# Patient Record
Sex: Female | Born: 1955
Health system: Southern US, Community
[De-identification: ages and names within clinical notes are randomized; demographics above are authoritative.]

---

## 2014-12-07 ENCOUNTER — Inpatient Hospital Stay
Admission: RE | Admit: 2014-12-07 | Discharge: 2014-12-30 | Disposition: A | Discharge status: Skilled Nursing Facility | Payer: Self-pay | Source: Other Acute Inpatient Hospital | Attending: Internal Medicine | Admitting: Internal Medicine

## 2014-12-07 ENCOUNTER — Other Ambulatory Visit (HOSPITAL_COMMUNITY): Payer: Self-pay

## 2014-12-07 DIAGNOSIS — Z794 Long term (current) use of insulin: Secondary | ICD-10-CM

## 2014-12-07 DIAGNOSIS — J690 Pneumonitis due to inhalation of food and vomit: Secondary | ICD-10-CM

## 2014-12-07 DIAGNOSIS — I639 Cerebral infarction, unspecified: Secondary | ICD-10-CM

## 2014-12-07 DIAGNOSIS — IMO0001 Reserved for inherently not codable concepts without codable children: Secondary | ICD-10-CM

## 2014-12-07 DIAGNOSIS — Z931 Gastrostomy status: Secondary | ICD-10-CM

## 2014-12-07 DIAGNOSIS — F411 Generalized anxiety disorder: Secondary | ICD-10-CM

## 2014-12-07 DIAGNOSIS — J9690 Respiratory failure, unspecified, unspecified whether with hypoxia or hypercapnia: Secondary | ICD-10-CM | POA: Insufficient documentation

## 2014-12-07 DIAGNOSIS — N179 Acute kidney failure, unspecified: Secondary | ICD-10-CM

## 2014-12-07 DIAGNOSIS — I469 Cardiac arrest, cause unspecified: Secondary | ICD-10-CM

## 2014-12-07 DIAGNOSIS — E119 Type 2 diabetes mellitus without complications: Secondary | ICD-10-CM

## 2014-12-07 DIAGNOSIS — Z930 Tracheostomy status: Secondary | ICD-10-CM

## 2014-12-08 ENCOUNTER — Other Ambulatory Visit (HOSPITAL_COMMUNITY): Payer: Self-pay

## 2014-12-08 DIAGNOSIS — E119 Type 2 diabetes mellitus without complications: Secondary | ICD-10-CM

## 2014-12-08 DIAGNOSIS — IMO0001 Reserved for inherently not codable concepts without codable children: Secondary | ICD-10-CM

## 2014-12-08 DIAGNOSIS — Z794 Long term (current) use of insulin: Secondary | ICD-10-CM

## 2014-12-08 DIAGNOSIS — J690 Pneumonitis due to inhalation of food and vomit: Secondary | ICD-10-CM

## 2014-12-08 DIAGNOSIS — J9610 Chronic respiratory failure, unspecified whether with hypoxia or hypercapnia: Secondary | ICD-10-CM

## 2014-12-08 DIAGNOSIS — J9690 Respiratory failure, unspecified, unspecified whether with hypoxia or hypercapnia: Secondary | ICD-10-CM | POA: Insufficient documentation

## 2014-12-08 DIAGNOSIS — I639 Cerebral infarction, unspecified: Secondary | ICD-10-CM

## 2014-12-08 DIAGNOSIS — F411 Generalized anxiety disorder: Secondary | ICD-10-CM

## 2014-12-08 DIAGNOSIS — I469 Cardiac arrest, cause unspecified: Secondary | ICD-10-CM

## 2014-12-08 DIAGNOSIS — Z930 Tracheostomy status: Secondary | ICD-10-CM

## 2014-12-08 DIAGNOSIS — N179 Acute kidney failure, unspecified: Secondary | ICD-10-CM

## 2014-12-08 DIAGNOSIS — Z931 Gastrostomy status: Secondary | ICD-10-CM

## 2014-12-08 LAB — COMPREHENSIVE METABOLIC PANEL
ALBUMIN: 2.8 g/dL — AB (ref 3.5–5.2)
ALT: 23 U/L (ref 0–35)
AST: 34 U/L (ref 0–37)
Alkaline Phosphatase: 65 U/L (ref 39–117)
Anion gap: 12 (ref 5–15)
BUN: 28 mg/dL — ABNORMAL HIGH (ref 6–23)
CHLORIDE: 102 mmol/L (ref 96–112)
CO2: 23 mmol/L (ref 19–32)
CREATININE: 1.02 mg/dL (ref 0.50–1.10)
Calcium: 9.5 mg/dL (ref 8.4–10.5)
GFR calc Af Amer: 68 mL/min — ABNORMAL LOW (ref >90)
GFR, EST NON AFRICAN AMERICAN: 59 mL/min — AB (ref >90)
GLUCOSE: 209 mg/dL — AB (ref 70–99)
Potassium: 3.7 mmol/L (ref 3.5–5.1)
Sodium: 137 mmol/L (ref 135–145)
TOTAL PROTEIN: 7.7 g/dL (ref 6.0–8.3)
Total Bilirubin: 0.3 mg/dL (ref 0.3–1.2)

## 2014-12-08 LAB — CBC
HCT: 30.5 % — ABNORMAL LOW (ref 36.0–46.0)
Hemoglobin: 10.1 g/dL — ABNORMAL LOW (ref 12.0–15.0)
MCH: 30.6 pg (ref 26.0–34.0)
MCHC: 33.1 g/dL (ref 30.0–36.0)
MCV: 92.4 fL (ref 78.0–100.0)
Platelets: 385 10*3/uL (ref 150–400)
RBC: 3.3 MIL/uL — ABNORMAL LOW (ref 3.87–5.11)
RDW: 13 % (ref 11.5–15.5)
WBC: 10 10*3/uL (ref 4.0–10.5)

## 2014-12-08 LAB — CLOSTRIDIUM DIFFICILE BY PCR: Toxigenic C. Difficile by PCR: POSITIVE — AB

## 2014-12-08 NOTE — Consult Note (Signed)
Name: Angie Porter MRN: [id number] DOB: [birth year]    ADMISSION DATE:  12/07/2014 CONSULTATION DATE: 1/26  [facility]  CHIEF COMPLAINT:  T collar  BRIEF PATIENT DESCRIPTION:  MO AF on t collar  SIGNIFICANT EVENTS  [facility]  STUDIES:     HISTORY OF PRESENT ILLNESS:   Discussion: 59 yo AAF with underlying IDDM , HTN, poorly controlled who presented to [facility] 11/21/14 with N/V and bradycardia. She suffered a brief cardiac arrest with bradycardia that required CPR and Epi. Along with intubation and 3 pressor support. She developed AKF and requires transient cvvh. 2 days post admit she suffered massive rt basal ganglion stroke and required tracheostomy. [facility] but remains trach and peg dependent. Her mental status is marginal and PCCM consulted for tracheostomy care.  PAST MEDICAL HISTORY :   has no past medical history on file.  has no past surgical history on file. Prior to Admission medications   reviewed   All: [place] FAMILY HISTORY:  Reviewed  SOCIAL HISTORY:   Reviewed  REVIEW OF SYSTEMS:   na SUBJECTIVE:   VITAL SIGNS: Vital signs reviewed. Abnormal values will appear under impression plan section.    PHYSICAL EXAMINATION: General:  Obese AAF on t collar Neuro: Follows commands weakly on rt side. Lethargic HEENT:  Trach->t collar Cardiovascular:  HSR RRR Lungs:  Decreased bs bases Abdomen:  Obese, peg in place Musculoskeletal:  intact Skin:  warm   Recent Labs Lab 12/08/14 0700  NA 137  K 3.7  CL 102  CO2 23  BUN 28*  CREATININE 1.02  GLUCOSE 209*    Recent Labs Lab 12/08/14 0700  HGB 10.1*  HCT 30.5*  WBC 10.0  PLT 385   Dg Chest Port 1 View  12/08/2014   CLINICAL DATA:  Respiratory failure.  EXAM: PORTABLE CHEST - 1 VIEW  COMPARISON:  None.  FINDINGS: A tracheostomy appliance is in place with the tip at the level of the clavicular heads. The lungs are borderline hypoinflated. The interstitial markings  are minimally prominent. The retrocardiac region on the left demonstrates increased density. There is no pleural effusion or pneumothorax. The cardiac silhouette is mildly enlarged. The pulmonary vascularity is not engorged.  IMPRESSION: There is left lower lobe atelectasis or pneumonia. There is no definite pulmonary edema.   Electronically Signed   By: David  [place]   On: 12/08/2014 07:49   Dg Abd Portable 1v  12/07/2014   CLINICAL DATA:  59 year old female with a history of percutaneous gastrostomy tube placement.  EXAM: PORTABLE ABDOMEN - 1 VIEW  COMPARISON:  None.  FINDINGS: Gas present within small bowel and colon. The stomach gas is present above the level of the transverse colon, suggesting ileocolic ligament intact.  Percutaneous gastrostomy tube projects over the left upper quadrant, with infused contrast within both the tube and stomach lumen. No extraluminal contrast is identified.  No unexpected radiopaque foreign objects.  Pelvic thermister in place.  No displaced fractures, with degenerative changes of the lumbar spine.  IMPRESSION: Unremarkable bowel gas pattern.  Percutaneous gastrostomy in place, with the tip confirmed in the stomach lumen by infusion of contrast.  Signed,  Yvone NeuJaime S. Loreta AveWagner, DO  [facility]  [facility]   Electronically Signed   By: Gilmer MorJaime  Wagner D.O.   On: 12/07/2014 23:25    ASSESSMENT  :   Tracheostomy dependent placed 12/02/14   CVA (cerebral infarction)    Cardiac  arrest 11/21/17   Hypoxic ischemic encephalopathy    Acute renal failure   IDDM (insulin dependent diabetes mellitus)   Aspiration pneumonia   Anxiety state   PEG (percutaneous endoscopic gastrostomy) status placed 12/04/14.  Discussion: 59 yo AAF with underlying IDDM , HTN, poorly controlled who presented to [facility]/21/14 with N/V and bradycardia. She suffered a brief cardiac arrest with bradycardia that required CPR and Epi. Along with intubation and 3  pressor support. She developed AKF and requires transient cvvh. 2 days post admit she suffered massive rt basal ganglion stroke and required tracheostomy. She was [facility] but remains trach and peg dependent. Her mental status is marginal and PCCM consulted for tracheostomy care.   PLAN: Trach collar until Mental status improved Follw cxr for rt lower lobe atx/asp pna  Brett Canales Minor ACNP Adolph Pollack PCCM Pager [phone] till 3 pm If no answer page [phone]  Chronic trach, on TC, mental status precludes decannulation, atelectatic RLL, aspiration can not be ruled out, would not recommend cuff down, recommend swallow evaluation, airway clearance measures as ordered.  Will follow up on Monday.  Patient seen and examined, agree with above note.  I dictated the care and orders written for this patient under my direction.  Alyson Reedy, MD [phone]  12/08/2014, 12:24 PM

## 2014-12-09 NOTE — Progress Notes (Signed)
Name: Angie Porter MRN: [id number] DOB: [birth year]    ADMISSION DATE:  12/07/2014 CONSULTATION DATE: 1/26  REFERRING MD :  [facility]  CHIEF COMPLAINT:  T collar  BRIEF PATIENT DESCRIPTION:  MO AF on t collar  SIGNIFICANT EVENTS  1/25 tx to [facility]  STUDIES:     HISTORY OF PRESENT ILLNESS:   Discussion: 59 yo AAF with underlying IDDM , HTN, poorly controlled who presented to [facility] 11/21/14 with N/V and bradycardia. She suffered a brief cardiac arrest with bradycardia that required CPR and Epi. Along with intubation and 3 pressor support. She developed AKF and requires transient cvvh. 2 days post admit she suffered massive rt basal ganglion stroke and required tracheostomy. She was [facility] from [facility] but remains trach and peg dependent. Her mental status is marginal and PCCM consulted for tracheostomy care.  PAST MEDICAL HISTORY :   has no past medical history on file.  has no past surgical history on file. Prior to Admission medications   reviewed   All: [place] FAMILY HISTORY:  Reviewed  SOCIAL HISTORY:   Reviewed  REVIEW OF SYSTEMS:   na SUBJECTIVE: No events.  VITAL SIGNS: Vital signs reviewed. Abnormal values will appear under impression plan section.    PHYSICAL EXAMINATION: General:  Obese AAF on t collar Neuro: Follows commands weakly on rt side. Lethargic HEENT:  Trach->t collar Cardiovascular:  HSR RRR Lungs:  Decreased bs bases Abdomen:  Obese, peg in place Musculoskeletal:  intact Skin:  warm   Recent Labs Lab 12/08/14 0700  NA 137  K 3.7  CL 102  CO2 23  BUN 28*  CREATININE 1.02  GLUCOSE 209*    Recent Labs Lab 12/08/14 0700  HGB 10.1*  HCT 30.5*  WBC 10.0  PLT 385   Dg Chest Port 1 View  12/08/2014   CLINICAL DATA:  Respiratory failure.  EXAM: PORTABLE CHEST - 1 VIEW  COMPARISON:  None.  FINDINGS: A tracheostomy appliance is in place with the tip at the level of the clavicular heads. The lungs are borderline hypoinflated. The interstitial  markings are minimally prominent. The retrocardiac region on the left demonstrates increased density. There is no pleural effusion or pneumothorax. The cardiac silhouette is mildly enlarged. The pulmonary vascularity is not engorged.  IMPRESSION: There is left lower lobe atelectasis or pneumonia. There is no definite pulmonary edema.   Electronically Signed   By: David  [place]   On: 12/08/2014 07:49   Dg Abd Portable 1v  12/07/2014   CLINICAL DATA:  59 year old female with a history of percutaneous gastrostomy tube placement.  EXAM: PORTABLE ABDOMEN - 1 VIEW  COMPARISON:  None.  FINDINGS: Gas present within small bowel and colon. The stomach gas is present above the level of the transverse colon, suggesting ileocolic ligament intact.  Percutaneous gastrostomy tube projects over the left upper quadrant, with infused contrast within both the tube and stomach lumen. No extraluminal contrast is identified.  No unexpected radiopaque foreign objects.  Pelvic thermister in place.  No displaced fractures, with degenerative changes of the lumbar spine.  IMPRESSION: Unremarkable bowel gas pattern.  Percutaneous gastrostomy in place, with the tip confirmed in the stomach lumen by infusion of contrast.  Signed,  Yvone Neu. Loreta Ave, DO  [facility]  [facility]   Electronically Signed   By: Gilmer Mor D.O.   On: 12/07/2014 23:25    ASSESSMENT  :   Tracheostomy dependent placed 12/02/14   CVA (cerebral infarction)  Cardiac arrest 11/21/17   Hypoxic ischemic encephalopathy    Acute renal failure   IDDM (insulin dependent diabetes mellitus)   Aspiration pneumonia   Anxiety state   PEG (percutaneous endoscopic gastrostomy) status placed 12/04/14.  Discussion: 59 yo AAF with underlying IDDM , HTN, poorly controlled who [facility] with N/V and bradycardia. She suffered a brief cardiac arrest with bradycardia that required CPR and Epi. Along with  intubation and 3 pressor support. She developed AKF and requires transient cvvh. 2 days post admit she suffered massive rt basal ganglion stroke and required tracheostomy. [facility] but remains trach and peg dependent. Her mental status is marginal and PCCM consulted for tracheostomy care.   PLAN: Trach collar 24/7. Follw cxr for rt lower lobe atx/asp pna with some resolution Not a candidate for decannulation ever given neuro status.  Alyson ReedyWesam G. Katianna Mcclenney, M.D. [facility]. Pager: [phone]. After hours pager: [phone].  12/09/2014, 2:27 PM

## 2014-12-13 LAB — BASIC METABOLIC PANEL
ANION GAP: 7 (ref 5–15)
BUN: 17 mg/dL (ref 6–23)
CO2: 28 mmol/L (ref 19–32)
Calcium: 10.1 mg/dL (ref 8.4–10.5)
Chloride: 100 mmol/L (ref 96–112)
Creatinine, Ser: 0.98 mg/dL (ref 0.50–1.10)
GFR, EST AFRICAN AMERICAN: 71 mL/min — AB (ref >90)
GFR, EST NON AFRICAN AMERICAN: 61 mL/min — AB (ref >90)
GLUCOSE: 428 mg/dL — AB (ref 70–99)
Potassium: 3.5 mmol/L (ref 3.5–5.1)
SODIUM: 135 mmol/L (ref 135–145)

## 2014-12-13 LAB — CBC
HCT: 34.5 % — ABNORMAL LOW (ref 36.0–46.0)
Hemoglobin: 11.7 g/dL — ABNORMAL LOW (ref 12.0–15.0)
MCH: 30.9 pg (ref 26.0–34.0)
MCHC: 33.9 g/dL (ref 30.0–36.0)
MCV: 91 fL (ref 78.0–100.0)
Platelets: 422 10*3/uL — ABNORMAL HIGH (ref 150–400)
RBC: 3.79 MIL/uL — AB (ref 3.87–5.11)
RDW: 13.3 % (ref 11.5–15.5)
WBC: 10.5 10*3/uL (ref 4.0–10.5)

## 2014-12-19 LAB — BASIC METABOLIC PANEL
Anion gap: 10 (ref 5–15)
BUN: 27 mg/dL — ABNORMAL HIGH (ref 6–23)
CHLORIDE: 97 mmol/L (ref 96–112)
CO2: 27 mmol/L (ref 19–32)
Calcium: 9.9 mg/dL (ref 8.4–10.5)
Creatinine, Ser: 1.1 mg/dL (ref 0.50–1.10)
GFR calc Af Amer: 62 mL/min — ABNORMAL LOW (ref >90)
GFR calc non Af Amer: 53 mL/min — ABNORMAL LOW (ref >90)
GLUCOSE: 365 mg/dL — AB (ref 70–99)
Potassium: 3.5 mmol/L (ref 3.5–5.1)
SODIUM: 134 mmol/L — AB (ref 135–145)

## 2014-12-19 LAB — CBC
HEMATOCRIT: 37 % (ref 36.0–46.0)
Hemoglobin: 12.6 g/dL (ref 12.0–15.0)
MCH: 31.3 pg (ref 26.0–34.0)
MCHC: 34.1 g/dL (ref 30.0–36.0)
MCV: 92 fL (ref 78.0–100.0)
Platelets: 282 10*3/uL (ref 150–400)
RBC: 4.02 MIL/uL (ref 3.87–5.11)
RDW: 13.6 % (ref 11.5–15.5)
WBC: 11.9 10*3/uL — AB (ref 4.0–10.5)

## 2014-12-21 DIAGNOSIS — J690 Pneumonitis due to inhalation of food and vomit: Secondary | ICD-10-CM

## 2014-12-21 NOTE — Progress Notes (Signed)
   Name: Angie Porter MRN: [id number] DOB: 1956-02-19    ADMISSION DATE:  12/07/2014 CONSULTATION DATE: 1/26  [facility]  CHIEF COMPLAINT:  Trach care  BRIEF PATIENT DESCRIPTION:  MO AF on t collar  SIGNIFICANT EVENTS  [facility]  STUDIES:    SUBJECTIVE: Doing well on ATC, now has # 6 cuffless trach.  VITAL SIGNS:  Mildly hypertensive otherwise VSS.   PHYSICAL EXAMINATION: General:  Obese AAF on ATC Neuro: Awake, follows commands, weak on R. HEENT:  Trach in place on ATC. Cardiovascular:  RRR. Lungs:  Decreased BS in bases. Abdomen:  Obese, peg in place. Musculoskeletal:  Intact. Skin:  Warm, dry.  Recent Labs Lab 12/19/14 0600  NA 134*  K 3.5  CL 97  CO2 27  BUN 27*  CREATININE 1.10  GLUCOSE 365*    Recent Labs Lab 12/19/14 0600  HGB 12.6  HCT 37.0  WBC 11.9*  PLT 282   No results found.  ASSESSMENT:   Tracheostomy dependent placed 12/02/14   CVA (cerebral infarction)    Cardiac arrest 11/21/17   Hypoxic ischemic encephalopathy    Acute renal failure   IDDM (insulin dependent diabetes mellitus)   Aspiration pneumonia   Anxiety state   PEG (percutaneous endoscopic gastrostomy) status placed 12/04/14.  Discussion: 59 yo AAF with underlying IDDM , HTN, poorly controlled who [facility] with N/V and bradycardia. She suffered a brief cardiac arrest with bradycardia that required CPR and Epi. Along with intubation and 3 pressor support. She developed AKF and requires transient cvvh. 2 days post admit she suffered massive rt basal ganglion stroke and required tracheostomy. [facility] but remains trach and peg dependent. Her mental status is marginal and PCCM consulted for tracheostomy care.  [facility] has now downsized to #6 cuffless trach and is doing well.  No secretions.   PLAN: Continue ATC.  Would not decanulate due to CVA / neuro status. F/u CXR intermittently. PCCM will sign off.  Please do not  hesitate to call us back if we can be of any further assistance.   Rutherford Guysahul Desai, PA - [facility] [place] [facility] Pgr: [phone]  or [phone]  Agree with above  ALVA,RAKESH V. md 12/21/2014, 12:15 PM

## 2014-12-26 LAB — BASIC METABOLIC PANEL
ANION GAP: 10 (ref 5–15)
BUN: 24 mg/dL — AB (ref 6–23)
CO2: 26 mmol/L (ref 19–32)
CREATININE: 1.02 mg/dL (ref 0.50–1.10)
Calcium: 9.9 mg/dL (ref 8.4–10.5)
Chloride: 101 mmol/L (ref 96–112)
GFR calc Af Amer: 68 mL/min — ABNORMAL LOW (ref >90)
GFR calc non Af Amer: 59 mL/min — ABNORMAL LOW (ref >90)
Glucose, Bld: 326 mg/dL — ABNORMAL HIGH (ref 70–99)
Potassium: 4 mmol/L (ref 3.5–5.1)
Sodium: 137 mmol/L (ref 135–145)

## 2014-12-26 LAB — CBC
HCT: 36.7 % (ref 36.0–46.0)
HEMOGLOBIN: 12.1 g/dL (ref 12.0–15.0)
MCH: 30.8 pg (ref 26.0–34.0)
MCHC: 33 g/dL (ref 30.0–36.0)
MCV: 93.4 fL (ref 78.0–100.0)
Platelets: 241 10*3/uL (ref 150–400)
RBC: 3.93 MIL/uL (ref 3.87–5.11)
RDW: 13.1 % (ref 11.5–15.5)
WBC: 8.9 10*3/uL (ref 4.0–10.5)

## 2014-12-28 LAB — BASIC METABOLIC PANEL WITH GFR
Anion gap: 9 (ref 5–15)
BUN: 19 mg/dL (ref 6–23)
CO2: 26 mmol/L (ref 19–32)
Calcium: 10.5 mg/dL (ref 8.4–10.5)
Chloride: 100 mmol/L (ref 96–112)
Creatinine, Ser: 0.93 mg/dL (ref 0.50–1.10)
GFR calc Af Amer: 76 mL/min — ABNORMAL LOW
GFR calc non Af Amer: 65 mL/min — ABNORMAL LOW
Glucose, Bld: 321 mg/dL — ABNORMAL HIGH (ref 70–99)
Potassium: 4.4 mmol/L (ref 3.5–5.1)
Sodium: 135 mmol/L (ref 135–145)

## 2014-12-28 LAB — CBC
HCT: 38 % (ref 36.0–46.0)
Hemoglobin: 13.3 g/dL (ref 12.0–15.0)
MCH: 31.9 pg (ref 26.0–34.0)
MCHC: 35 g/dL (ref 30.0–36.0)
MCV: 91.1 fL (ref 78.0–100.0)
Platelets: 280 K/uL (ref 150–400)
RBC: 4.17 MIL/uL (ref 3.87–5.11)
RDW: 12.7 % (ref 11.5–15.5)
WBC: 10.5 K/uL (ref 4.0–10.5)

## 2015-07-14 IMAGING — CR DG CHEST 1V PORT
1 series · 1 of 1 positions shown · non-contrast
Comparison: None.

CLINICAL DATA: Respiratory failure.

EXAM:
PORTABLE CHEST - 1 VIEW

[AP]
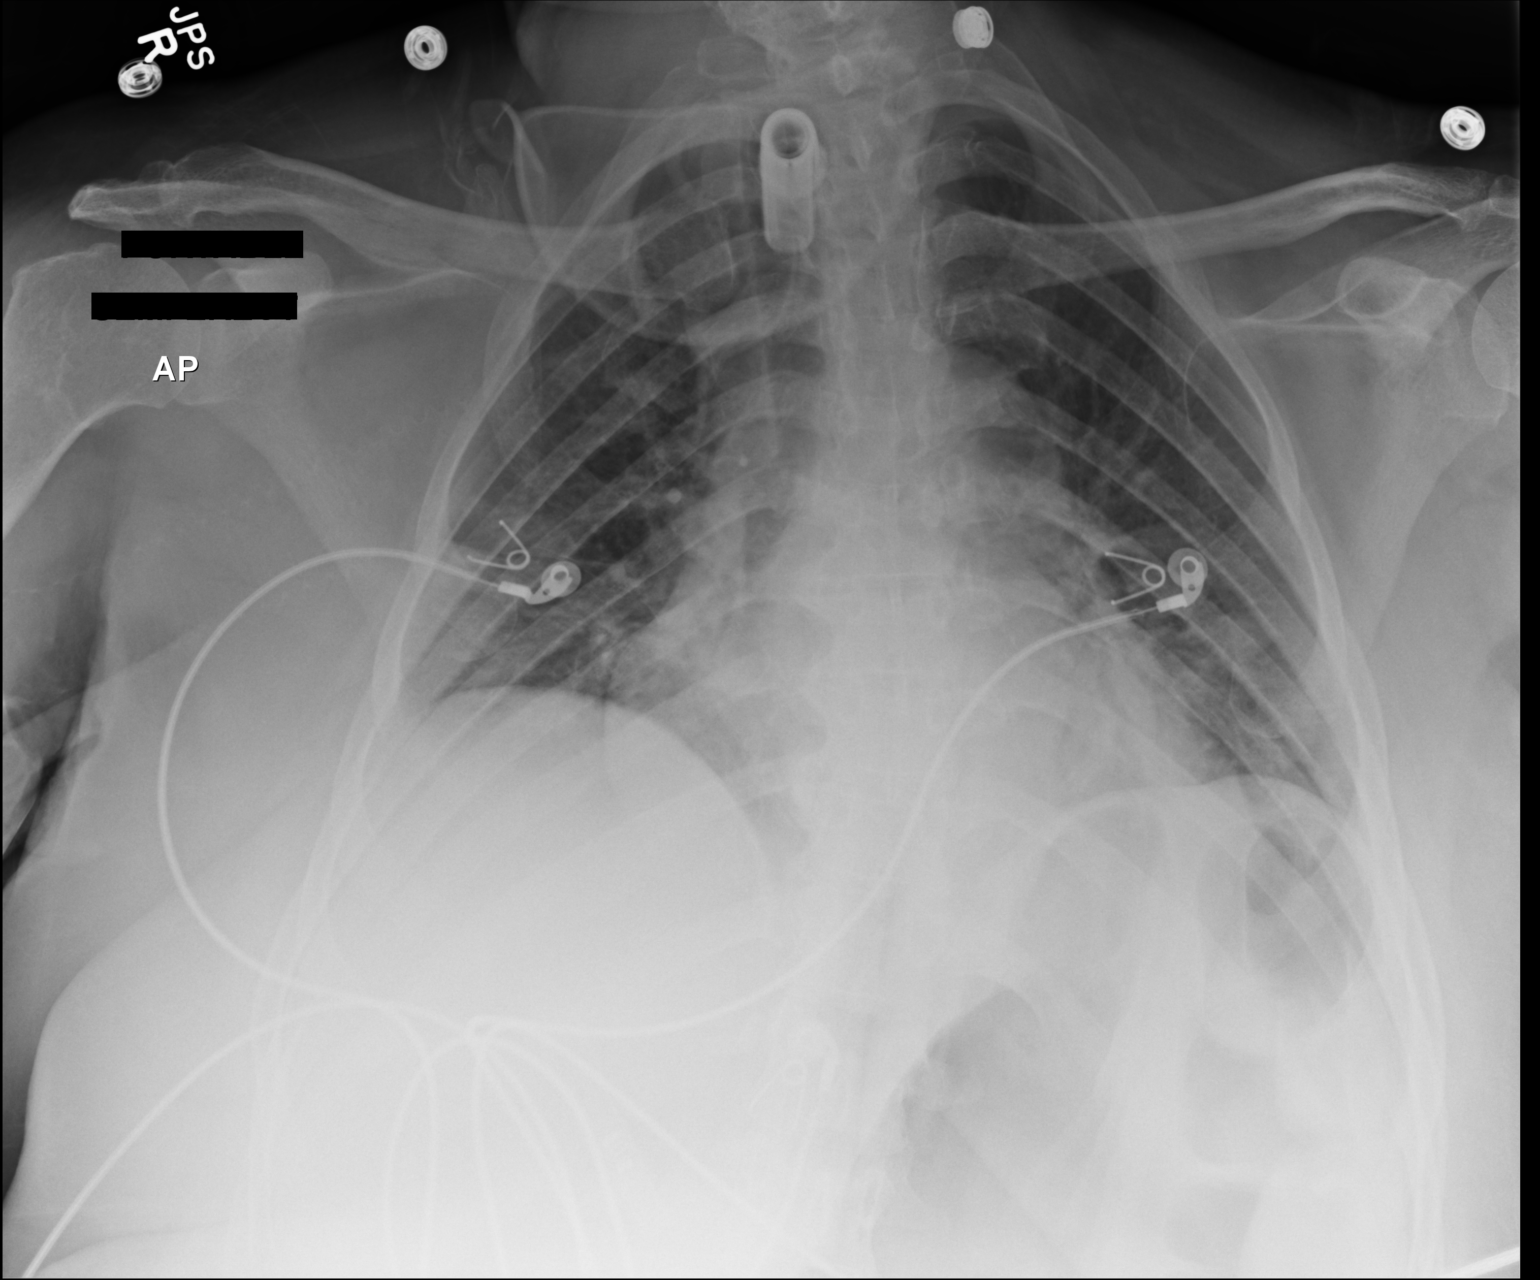

[1 of 1 positions shown; findings below may reference images not displayed]

FINDINGS: A tracheostomy appliance is in place with the tip at the level of
the clavicular heads. The lungs are borderline hypoinflated. The
interstitial markings are minimally prominent. The retrocardiac
region on the left demonstrates increased density. There is no
pleural effusion or pneumothorax. The cardiac silhouette is mildly
enlarged. The pulmonary vascularity is not engorged.
IMPRESSION: There is left lower lobe atelectasis or pneumonia. There is no
definite pulmonary edema.

## 2024-04-13 DEATH — deceased
